# Patient Record
Sex: Female | Born: 1940 | Race: White | Hispanic: No | Marital: Married | Smoking: Never smoker
Health system: Southern US, Community
[De-identification: ages and names within clinical notes are randomized; demographics above are authoritative.]

## PROBLEM LIST (undated history)

## (undated) DIAGNOSIS — J449 Chronic obstructive pulmonary disease, unspecified: Secondary | ICD-10-CM

## (undated) DIAGNOSIS — E119 Type 2 diabetes mellitus without complications: Secondary | ICD-10-CM

## (undated) HISTORY — PX: ABDOMINAL HYSTERECTOMY: SHX81

## (undated) HISTORY — PX: APPENDECTOMY: SHX54

## (undated) HISTORY — PX: TONSILLECTOMY: SUR1361

## (undated) HISTORY — PX: REPLACEMENT TOTAL KNEE BILATERAL: SUR1225

## (undated) HISTORY — PX: CHOLECYSTECTOMY: SHX55

---

## 2004-12-30 ENCOUNTER — Ambulatory Visit: Payer: Self-pay | Admitting: Family Medicine

## 2006-09-26 ENCOUNTER — Ambulatory Visit: Payer: Self-pay | Admitting: Family Medicine

## 2007-12-07 ENCOUNTER — Ambulatory Visit: Payer: Self-pay | Admitting: Family Medicine

## 2007-12-30 ENCOUNTER — Ambulatory Visit: Payer: Self-pay | Admitting: Unknown Physician Specialty

## 2008-02-19 ENCOUNTER — Ambulatory Visit: Payer: Self-pay | Admitting: Unknown Physician Specialty

## 2008-03-27 ENCOUNTER — Ambulatory Visit: Payer: Self-pay | Admitting: Specialist

## 2008-05-20 ENCOUNTER — Ambulatory Visit: Payer: Self-pay

## 2009-11-28 ENCOUNTER — Ambulatory Visit: Payer: Self-pay | Admitting: Family Medicine

## 2011-03-09 ENCOUNTER — Ambulatory Visit: Payer: Self-pay | Admitting: Family Medicine

## 2012-03-09 ENCOUNTER — Ambulatory Visit: Payer: Self-pay | Admitting: Physician Assistant

## 2013-04-11 ENCOUNTER — Ambulatory Visit: Payer: Self-pay | Admitting: Family Medicine

## 2013-10-25 ENCOUNTER — Ambulatory Visit: Payer: Self-pay | Admitting: Physician Assistant

## 2014-03-01 ENCOUNTER — Ambulatory Visit: Payer: Self-pay | Admitting: Physician Assistant

## 2015-03-06 ENCOUNTER — Encounter: Payer: Self-pay | Admitting: Emergency Medicine

## 2015-03-06 ENCOUNTER — Ambulatory Visit (INDEPENDENT_AMBULATORY_CARE_PROVIDER_SITE_OTHER): Payer: Medicare HMO

## 2015-03-06 ENCOUNTER — Ambulatory Visit
Admission: EM | Admit: 2015-03-06 | Discharge: 2015-03-06 | Disposition: A | Discharge status: Home / Self Care | Payer: Medicare HMO | Attending: Family Medicine | Admitting: Family Medicine

## 2015-03-06 DIAGNOSIS — J40 Bronchitis, not specified as acute or chronic: Secondary | ICD-10-CM | POA: Diagnosis not present

## 2015-03-06 HISTORY — DX: Type 2 diabetes mellitus without complications: E11.9

## 2015-03-06 HISTORY — DX: Chronic obstructive pulmonary disease, unspecified: J44.9

## 2015-03-06 MED ORDER — AZITHROMYCIN 250 MG PO TABS: ORAL_TABLET | ORAL | Status: DC

## 2015-03-06 MED ORDER — HYDROCOD POLST-CPM POLST ER 10-8 MG/5ML PO SUER: 5.0000 mL | Freq: Two times a day (BID) | ORAL | Status: AC

## 2015-03-06 NOTE — ED Provider Notes (Signed)
CSN: [id number]     Arrival date & time 03/06/15  1044 History   First MD Initiated Contact with Patient 03/06/15 1122     Chief Complaint  Patient presents with  . Cough   (Consider location/radiation/quality/duration/timing/severity/associated sxs/prior Treatment) HPI   This a 74 year old female who presents with a 9 day history sinus drainage constant coughing of yellow greenish sputum chest congestion and sinus pressure. Denies any fevers. Has been using a nebulizer at home as she has a diagnosis of COPD along with diabetes mellitus. She has been sleeping in a semisitting position to try and help her cough. She is complaining of some shortness of breath but her O2 sat on room air is 99%. temperature today at 97.7F pulse 80 respirations 17 blood pressure 150/69. He has been using her inhalers at home along with a nebulizer.  Past Medical History  Diagnosis Date  . Diabetes mellitus without complication (HCC)   . COPD (chronic obstructive pulmonary disease) [facility])    Past Surgical History  Procedure Laterality Date  . Abdominal hysterectomy    . Cholecystectomy    . Appendectomy    . Tonsillectomy    . Replacement total knee bilateral     History reviewed. No pertinent family history. Social History  Substance Use Topics  . Smoking status: Never Smoker   . Smokeless tobacco: None  . Alcohol Use: No   OB History    No data available     Review of Systems  Constitutional: Positive for activity change. Negative for fever, chills, diaphoresis and fatigue.  HENT: Positive for congestion, postnasal drip, rhinorrhea, sinus pressure and sneezing.   Respiratory: Positive for cough, shortness of breath and wheezing. Negative for stridor.   All other systems reviewed and are negative.   Allergies  Penicillins and Codeine  Home Medications   Prior to Admission medications   Medication Sig Start Date End Date Taking? Authorizing Provider  albuterol (PROVENTIL HFA;VENTOLIN HFA)  108 (90 Base) MCG/ACT inhaler Inhale 2 puffs into the lungs every 4 (four) hours as needed for wheezing or shortness of breath.   Yes Historical Provider, MD  budesonide-formoterol (SYMBICORT) 160-4.5 MCG/ACT inhaler Inhale 2 puffs into the lungs 2 (two) times daily.   Yes Historical Provider, MD  glipiZIDE (GLUCOTROL) 10 MG tablet Take 10 mg by mouth daily before breakfast.   Yes Historical Provider, MD  sertraline (ZOLOFT) 100 MG tablet Take 200 mg by mouth daily.   Yes Historical Provider, MD  azithromycin (ZITHROMAX Z-PAK) 250 MG tablet Use as per package instructions 03/06/15   Lutricia Feil, PA-C  chlorpheniramine-HYDROcodone [facility]) 10-8 MG/5ML SUER Take 5 mLs by mouth 2 (two) times daily. 03/06/15   Lutricia Feil, PA-C   Meds Ordered and Administered this Visit  Medications - No data to display  BP 150/69 mmHg  Pulse 80  Temp(Src) 97.4 F (36.3 C) (Tympanic)  Resp 17  Ht  (1.626 m)  Wt 180 lb (81.647 kg)  BMI 30.88 kg/m2  SpO2 99% No data found.   Physical Exam  Constitutional: She is oriented to person, place, and time. She appears well-developed and well-nourished. No distress.  HENT:  Head: Normocephalic and atraumatic.  Nose: Nose normal.  Mouth/Throat: Oropharynx is clear and moist. No oropharyngeal exudate.  Both TMs are dull.  Eyes: Conjunctivae are normal. Pupils are equal, round, and reactive to light. Right eye exhibits no discharge. Left eye exhibits no discharge.  Neck: Normal range of motion.  Pulmonary/Chest:  Effort normal. No stridor. No respiratory distress. She has wheezes. She has no rales.  Musculoskeletal: Normal range of motion. She exhibits no edema or tenderness.  Lymphadenopathy:    She has no cervical adenopathy.  Neurological: She is alert and oriented to person, place, and time.  Skin: Skin is warm and dry. She is not diaphoretic.  Psychiatric: She has a normal mood and affect. Her behavior is normal. Judgment and  thought content normal.  Nursing note and vitals reviewed.   ED Course  Procedures (including critical care time)  Labs Review Labs Reviewed - No data to display  Imaging Review Dg Chest 2 View  03/06/2015  CLINICAL DATA:  Productive cough. EXAM: CHEST  2 VIEW COMPARISON:  March 01, 2014. FINDINGS: The heart size and mediastinal contours are within normal limits. Both lungs are clear. No pneumothorax or pleural effusion is noted. Stable small hiatal hernia is noted. The visualized skeletal structures are unremarkable. IMPRESSION: Stable small hiatal hernia. No acute cardiopulmonary abnormality seen. Electronically Signed   By: Lupita RaiderJames  Green Jr, M.D.   On: 03/06/2015 12:04     Visual Acuity Review  Right Eye Distance:   Left Eye Distance:   Bilateral Distance:    Right Eye Near:   Left Eye Near:    Bilateral Near:         MDM   1. Bronchitis    Discharge Medication List as of 03/06/2015 12:15 PM    START taking these medications   Details  azithromycin (ZITHROMAX Z-PAK) 250 MG tablet Use as per package instructions, Normal    chlorpheniramine-HYDROcodone (TUSSIONEX PENNKINETIC ER) 10-8 MG/5ML SUER Take 5 mLs by mouth 2 (two) times daily., Starting 03/06/2015, Until Discontinued, Print      Plan: 1. Test/x-ray results and diagnosis reviewed with patient 2. rx as per orders; risks, benefits, potential side effects reviewed with patient 3. Recommend supportive treatment with fluids and rest. Patient states that she is not allergic to hydrocodone is able to tolerate that well. She also has nebulizers and inhalers at home which she uses and will continue to use. She also states that the Z-Paks worked the best for her and this was prescribed. Reviewed the x-ray results which did not show any effusion or infiltrate. Pressure to [facility] next week 4. F/u prn if symptoms worsen or don't improve     Lutricia FeilWilliam P Roemer, PA-C 03/06/15  1218

## 2015-03-06 NOTE — ED Notes (Signed)
Patient c/o cough and chest congestion and sinus pressure for over a week.  Patient denies fevers.

## 2015-03-06 NOTE — Discharge Instructions (Signed)
How to Use an Inhaler °Proper inhaler technique is very important. Good technique ensures that the medicine reaches the lungs. Poor technique results in depositing the medicine on the tongue and back of the throat rather than in the airways. If you do not use the inhaler with good technique, the medicine will not help you. °STEPS TO FOLLOW IF USING AN INHALER WITHOUT AN EXTENSION TUBE °· Remove the cap from the inhaler. °· If you are using the inhaler for the first time, you will need to prime it. Shake the inhaler for 5 seconds and release four puffs into the air, away from your face. Ask your health care provider or pharmacist if you have questions about priming your inhaler. °· Shake the inhaler for 5 seconds before each breath in (inhalation). °· Position the inhaler so that the top of the canister faces up. °· Put your index finger on the top of the medicine canister. Your thumb supports the bottom of the inhaler. °· Open your mouth. °· Either place the inhaler between your teeth and place your lips tightly around the mouthpiece, or hold the inhaler 1-2 inches away from your open mouth. If you are unsure of which technique to use, ask your health care provider. °· Breathe out (exhale) normally and as completely as possible. °· Press the canister down with your index finger to release the medicine. °· At the same time as the canister is pressed, inhale deeply and slowly until your lungs are completely filled. This should take 4-6 seconds. Keep your tongue down. °· Hold the medicine in your lungs for 5-10 seconds (10 seconds is best). This helps the medicine get into the small airways of your lungs. °· Breathe out slowly, through pursed lips. Whistling is an example of pursed lips. °· Wait at least 15-30 seconds between puffs. Continue with the above steps until you have taken the number of puffs your health care provider has ordered. Do not use the inhaler more than your health care provider tells  you. °· Replace the cap on the inhaler. °· Follow the directions from your health care provider or the inhaler insert for cleaning the inhaler. °STEPS TO FOLLOW IF USING AN INHALER WITH AN EXTENSION (SPACER) °· Remove the cap from the inhaler. °· If you are using the inhaler for the first time, you will need to prime it. Shake the inhaler for 5 seconds and release four puffs into the air, away from your face. Ask your health care provider or pharmacist if you have questions about priming your inhaler. °· Shake the inhaler for 5 seconds before each breath in (inhalation). °· Place the open end of the spacer onto the mouthpiece of the inhaler. °· Position the inhaler so that the top of the canister faces up and the spacer mouthpiece faces you. °· Put your index finger on the top of the medicine canister. Your thumb supports the bottom of the inhaler and the spacer. °· Breathe out (exhale) normally and as completely as possible. °· Immediately after exhaling, place the spacer between your teeth and into your mouth. Close your lips tightly around the spacer. °· Press the canister down with your index finger to release the medicine. °· At the same time as the canister is pressed, inhale deeply and slowly until your lungs are completely filled. This should take 4-6 seconds. Keep your tongue down and out of the way. °· Hold the medicine in your lungs for 5-10 seconds (10 seconds is best). This helps the   medicine get into the small airways of your lungs. Exhale. °· Repeat inhaling deeply through the spacer mouthpiece. Again hold that breath for up to 10 seconds (10 seconds is best). Exhale slowly. If it is difficult to take this second deep breath through the spacer, breathe normally several times through the spacer. Remove the spacer from your mouth. °· Wait at least 15-30 seconds between puffs. Continue with the above steps until you have taken the number of puffs your health care provider has ordered. Do not use the  inhaler more than your health care provider tells you. °· Remove the spacer from the inhaler, and place the cap on the inhaler. °· Follow the directions from your health care provider or the inhaler insert for cleaning the inhaler and spacer. °If you are using different kinds of inhalers, use your quick relief medicine to open the airways 10-15 minutes before using a steroid if instructed to do so by your health care provider. If you are unsure which inhalers to use and the order of using them, ask your health care provider, nurse, or respiratory therapist. °If you are using a steroid inhaler, always rinse your mouth with water after your last puff, then gargle and spit out the water. Do not swallow the water. °AVOID: °· Inhaling before or after starting the spray of medicine. It takes practice to coordinate your breathing with triggering the spray. °· Inhaling through the nose (rather than the mouth) when triggering the spray. °HOW TO DETERMINE IF YOUR INHALER IS FULL OR NEARLY EMPTY °You cannot know when an inhaler is empty by shaking it. A few inhalers are now being made with dose counters. Ask your health care provider for a prescription that has a dose counter if you feel you need that extra help. If your inhaler does not have a counter, ask your health care provider to help you determine the date you need to refill your inhaler. Write the refill date on a calendar or your inhaler canister. Refill your inhaler 7-10 days before it runs out. Be sure to keep an adequate supply of medicine. This includes making sure it is not expired, and that you have a spare inhaler.  °SEEK MEDICAL CARE IF:  °· Your symptoms are only partially relieved with your inhaler. °· You are having trouble using your inhaler. °· You have some increase in phlegm. °SEEK IMMEDIATE MEDICAL CARE IF:  °· You feel little or no relief with your inhalers. You are still wheezing and are feeling shortness of breath or tightness in your chest or  both. °· You have dizziness, headaches, or a fast heart rate. °· You have chills, fever, or night sweats. °· You have a noticeable increase in phlegm production, or there is blood in the phlegm. °MAKE SURE YOU:  °· Understand these instructions. °· Will watch your condition. °· Will get help right away if you are not doing well or get worse. °  °This information is not intended to replace advice given to you by your health care provider. Make sure you discuss any questions you have with your health care provider. °  °Document Released: 02/18/2000 Document Revised: 12/11/2012 Document Reviewed: 09/19/2012 °Elsevier Interactive Patient Education ©2016 [employer] ° °Upper Respiratory Infection, Adult °Most upper respiratory infections (URIs) are a viral infection of the air passages leading to the lungs. A URI affects the nose, throat, and upper air passages. The most common type of URI is nasopharyngitis and is typically referred to as "the common cold." °  URIs run their course and usually go away on their own. Most of the time, a URI does not require medical attention, but sometimes a bacterial infection in the upper airways can follow a viral infection. This is called a secondary infection. Sinus and middle ear infections are common types of secondary upper respiratory infections. °Bacterial pneumonia can also complicate a URI. A URI can worsen asthma and chronic obstructive pulmonary disease (COPD). Sometimes, these complications can require emergency medical care and may be life threatening.  °CAUSES °Almost all URIs are caused by viruses. A virus is a type of germ and can spread from one person to another.  °RISKS FACTORS °You may be at risk for a URI if:  °· You smoke.   °· You have chronic heart or lung disease. °· You have a weakened defense (immune) system.   °· You are very young or very old.   °· You have nasal allergies or asthma. °· You work in crowded or poorly ventilated areas. °· You work in health  care facilities or schools. °SIGNS AND SYMPTOMS  °Symptoms typically develop 2-3 days after you come in contact with a cold virus. Most viral URIs last 7-10 days. However, viral URIs from the influenza virus (flu virus) can last 14-18 days and are typically more severe. Symptoms may include:  °· Runny or stuffy (congested) nose.   °· Sneezing.   °· Cough.   °· Sore throat.   °· Headache.   °· Fatigue.   °· Fever.   °· Loss of appetite.   °· Pain in your forehead, behind your eyes, and over your cheekbones (sinus pain). °· Muscle aches.   °DIAGNOSIS  °Your health care provider may diagnose a URI by: °· Physical exam. °· Tests to check that your symptoms are not due to another condition such as: °¨ Strep throat. °¨ Sinusitis. °¨ Pneumonia. °¨ Asthma. °TREATMENT  °A URI goes away on its own with time. It cannot be cured with medicines, but medicines may be prescribed or recommended to relieve symptoms. Medicines may help: °· Reduce your fever. °· Reduce your cough. °· Relieve nasal congestion. °HOME CARE INSTRUCTIONS  °· Take medicines only as directed by your health care provider.   °· Gargle warm saltwater or take cough drops to comfort your throat as directed by your health care provider. °· Use a warm mist humidifier or inhale steam from a shower to increase air moisture. This may make it easier to breathe. °· Drink enough fluid to keep your urine clear or pale yellow.   °· Eat soups and other clear broths and maintain good nutrition.   °· Rest as needed.   °· Return to work when your temperature has returned to normal or as your health care provider advises. You may need to stay home longer to avoid infecting others. You can also use a face mask and careful hand washing to prevent spread of the virus. °· Increase the usage of your inhaler if you have asthma.   °· Do not use any tobacco products, including cigarettes, chewing tobacco, or electronic cigarettes. If you need help quitting, ask your health care  provider. °PREVENTION  °The best way to protect yourself from getting a cold is to practice good hygiene.  °· Avoid oral or hand contact with people with cold symptoms.   °· Wash your hands often if contact occurs.   °There is no clear evidence that vitamin C, vitamin E, echinacea, or exercise reduces the chance of developing a cold. However, it is always recommended to get plenty of rest, exercise, and practice good nutrition.  °  SEEK MEDICAL CARE IF:  °· You are getting worse rather than better.   °· Your symptoms are not controlled by medicine.   °· You have chills. °· You have worsening shortness of breath. °· You have brown or red mucus. °· You have yellow or brown nasal discharge. °· You have pain in your face, especially when you bend forward. °· You have a fever. °· You have swollen neck glands. °· You have pain while swallowing. °· You have white areas in the back of your throat. °SEEK IMMEDIATE MEDICAL CARE IF:  °· You have severe or persistent: °¨ Headache. °¨ Ear pain. °¨ Sinus pain. °¨ Chest pain. °· You have chronic lung disease and any of the following: °¨ Wheezing. °¨ Prolonged cough. °¨ Coughing up blood. °¨ A change in your usual mucus. °· You have a stiff neck. °· You have changes in your: °¨ Vision. °¨ Hearing. °¨ Thinking. °¨ Mood. °MAKE SURE YOU:  °· Understand these instructions. °· Will watch your condition. °· Will get help right away if you are not doing well or get worse. °  °This information is not intended to replace advice given to you by your health care provider. Make sure you discuss any questions you have with your health care provider. °  °Document Released: 08/16/2000 Document Revised: 07/07/2014 Document Reviewed: 05/28/2013 °Elsevier Interactive Patient Education ©2016 Elsevier Inc. ° °

## 2015-06-13 ENCOUNTER — Ambulatory Visit
Admission: EM | Admit: 2015-06-13 | Discharge: 2015-06-13 | Disposition: A | Discharge status: Home / Self Care | Payer: Medicare HMO | Attending: Family Medicine | Admitting: Family Medicine

## 2015-06-13 ENCOUNTER — Encounter: Payer: Self-pay | Admitting: Gynecology

## 2015-06-13 DIAGNOSIS — S161XXA Strain of muscle, fascia and tendon at neck level, initial encounter: Secondary | ICD-10-CM

## 2015-06-13 MED ORDER — CYCLOBENZAPRINE HCL 10 MG PO TABS: 10.0000 mg | ORAL_TABLET | Freq: Every day | ORAL | Status: AC

## 2015-06-13 MED ORDER — HYDROCODONE-ACETAMINOPHEN 5-325 MG PO TABS: 1.0000 | ORAL_TABLET | Freq: Four times a day (QID) | ORAL | Status: AC | PRN

## 2015-06-13 NOTE — ED Notes (Signed)
Per patient stiff neck on right side of neck x 2 weeks.

## 2015-06-13 NOTE — ED Provider Notes (Signed)
CSN: [id number]     Arrival date & time 06/13/15  0930 History   None    Chief Complaint  Patient presents with  . Torticollis   (Consider location/radiation/quality/duration/timing/severity/associated sxs/prior Treatment) HPI Comments: 75 yo female with a 2 weeks h/o right sided neck pain and stiffness. Denies any injury/trauma, numbness/tingling, fevers, chills. Pain is worse with movement or certain positions.   The history is provided by the patient.    Past Medical History  Diagnosis Date  . Diabetes mellitus without complication (HCC)   . COPD (chronic obstructive pulmonary disease) [facility](HCC)    Past Surgical History  Procedure Laterality Date  . Abdominal hysterectomy    . Cholecystectomy    . Appendectomy    . Tonsillectomy    . Replacement total knee bilateral     No family history on file. Social History  Substance Use Topics  . Smoking status: Never Smoker   . Smokeless tobacco: None  . Alcohol Use: No   OB History    No data available     Review of Systems  Allergies  Penicillins and Codeine  Home Medications   Prior to Admission medications   Medication Sig Start Date End Date Taking? Authorizing Provider  budesonide-formoterol (SYMBICORT) 160-4.5 MCG/ACT inhaler Inhale 2 puffs into the lungs 2 (two) times daily.   Yes Historical Provider, MD  glipiZIDE (GLUCOTROL) 10 MG tablet Take 10 mg by mouth daily before breakfast.   Yes Historical Provider, MD  sertraline (ZOLOFT) 100 MG tablet Take 200 mg by mouth daily.   Yes Historical Provider, MD  albuterol (PROVENTIL HFA;VENTOLIN HFA) 108 (90 Base) MCG/ACT inhaler Inhale 2 puffs into the lungs every 4 (four) hours as needed for wheezing or shortness of breath.    Historical Provider, MD  azithromycin (ZITHROMAX Z-PAK) 250 MG tablet Use as per package instructions 03/06/15   Lutricia FeilWilliam P Roemer, PA-C  chlorpheniramine-HYDROcodone [facility](TUSSIONEX PENNKINETIC ER) 10-8 MG/5ML SUER Take 5 mLs by mouth 2 (two) times daily.  03/06/15   Lutricia FeilWilliam P Roemer, PA-C  cyclobenzaprine (FLEXERIL) 10 MG tablet Take 1 tablet (10 mg total) by mouth at bedtime. 06/13/15   Payton Mccallumrlando Jaren Kearn, MD  HYDROcodone-acetaminophen (NORCO/VICODIN) 5-325 MG tablet Take 1-2 tablets by mouth every 6 (six) hours as needed. 06/13/15   Payton Mccallumrlando Briceyda Abdullah, MD   Meds Ordered and Administered this Visit  Medications - No data to display  BP 135/79 mmHg  Pulse 68  Temp(Src) 98.1 F (36.7 C) (Oral)  Resp 16  Ht 5\' 3"  (1.6 m)  Wt 180 lb (81.647 kg)  BMI 31.89 kg/m2  SpO2 98% No data found.   Physical Exam  Constitutional: She is oriented to person, place, and time. She appears well-developed and well-nourished. No distress.  HENT:  Head: Normocephalic and atraumatic.  Eyes: EOM are normal. Pupils are equal, round, and reactive to light.  Neck: Normal range of motion. Neck supple. No tracheal deviation present. No thyromegaly present.  Musculoskeletal: She exhibits no edema.       Cervical back: She exhibits tenderness (over the cervical paraspinous muscles) and spasm. She exhibits normal range of motion, no bony tenderness, no swelling, no edema, no deformity, no laceration and normal pulse.  Lymphadenopathy:    She has no cervical adenopathy.  Neurological: She is alert and oriented to person, place, and time. She has normal reflexes. No cranial nerve deficit. She exhibits normal muscle tone. Coordination normal.  Skin: No rash noted. She is not diaphoretic.  Nursing note and vitals reviewed.  ED Course  Procedures (including critical care time)  Labs Review Labs Reviewed - No data to display  Imaging Review No results found.   Visual Acuity Review  Right Eye Distance:   Left Eye Distance:   Bilateral Distance:    Right Eye Near:   Left Eye Near:    Bilateral Near:         MDM   1. Neck strain, initial encounter    Discharge Medication List as of 06/13/2015 11:50 AM    START taking these medications   Details   cyclobenzaprine (FLEXERIL) 10 MG tablet Take 1 tablet (10 mg total) by mouth at bedtime., Starting 06/13/2015, Until Discontinued, Normal    HYDROcodone-acetaminophen (NORCO/VICODIN) 5-325 MG tablet Take 1-2 tablets by mouth every 6 (six) hours as needed., Starting 06/13/2015, Until Discontinued, Print       1. diagnosis reviewed with patient 2. rx as per orders above; reviewed possible side effects, interactions, risks and benefits  3. Recommend supportive treatment with heat, gentle range of motion exercises, massage 4. Follow-up prn if symptoms worsen or don't improve    Payton Mccallum, MD 06/13/15 1404

## 2016-05-25 ENCOUNTER — Other Ambulatory Visit: Payer: Self-pay | Admitting: Physician Assistant

## 2016-05-25 DIAGNOSIS — Z1239 Encounter for other screening for malignant neoplasm of breast: Secondary | ICD-10-CM

## 2016-06-23 ENCOUNTER — Ambulatory Visit: Payer: Medicare HMO

## 2016-06-29 ENCOUNTER — Ambulatory Visit: Payer: Medicare HMO

## 2016-09-07 ENCOUNTER — Ambulatory Visit
Admission: RE | Admit: 2016-09-07 | Discharge: 2016-09-07 | Disposition: A | Discharge status: Home / Self Care | Payer: Medicare HMO | Source: Ambulatory Visit | Attending: Physician Assistant | Admitting: Physician Assistant

## 2016-09-07 DIAGNOSIS — Z1231 Encounter for screening mammogram for malignant neoplasm of breast: Secondary | ICD-10-CM | POA: Insufficient documentation

## 2016-09-07 DIAGNOSIS — Z1239 Encounter for other screening for malignant neoplasm of breast: Secondary | ICD-10-CM

## 2018-01-13 IMAGING — MG MM DIGITAL SCREENING BILAT W/ CAD
4 series · 4 of 4 positions shown · non-contrast
Comparison: Previous exam(s).

CLINICAL DATA: Screening.

EXAM:
DIGITAL SCREENING BILATERAL MAMMOGRAM WITH CAD

[L MLO]
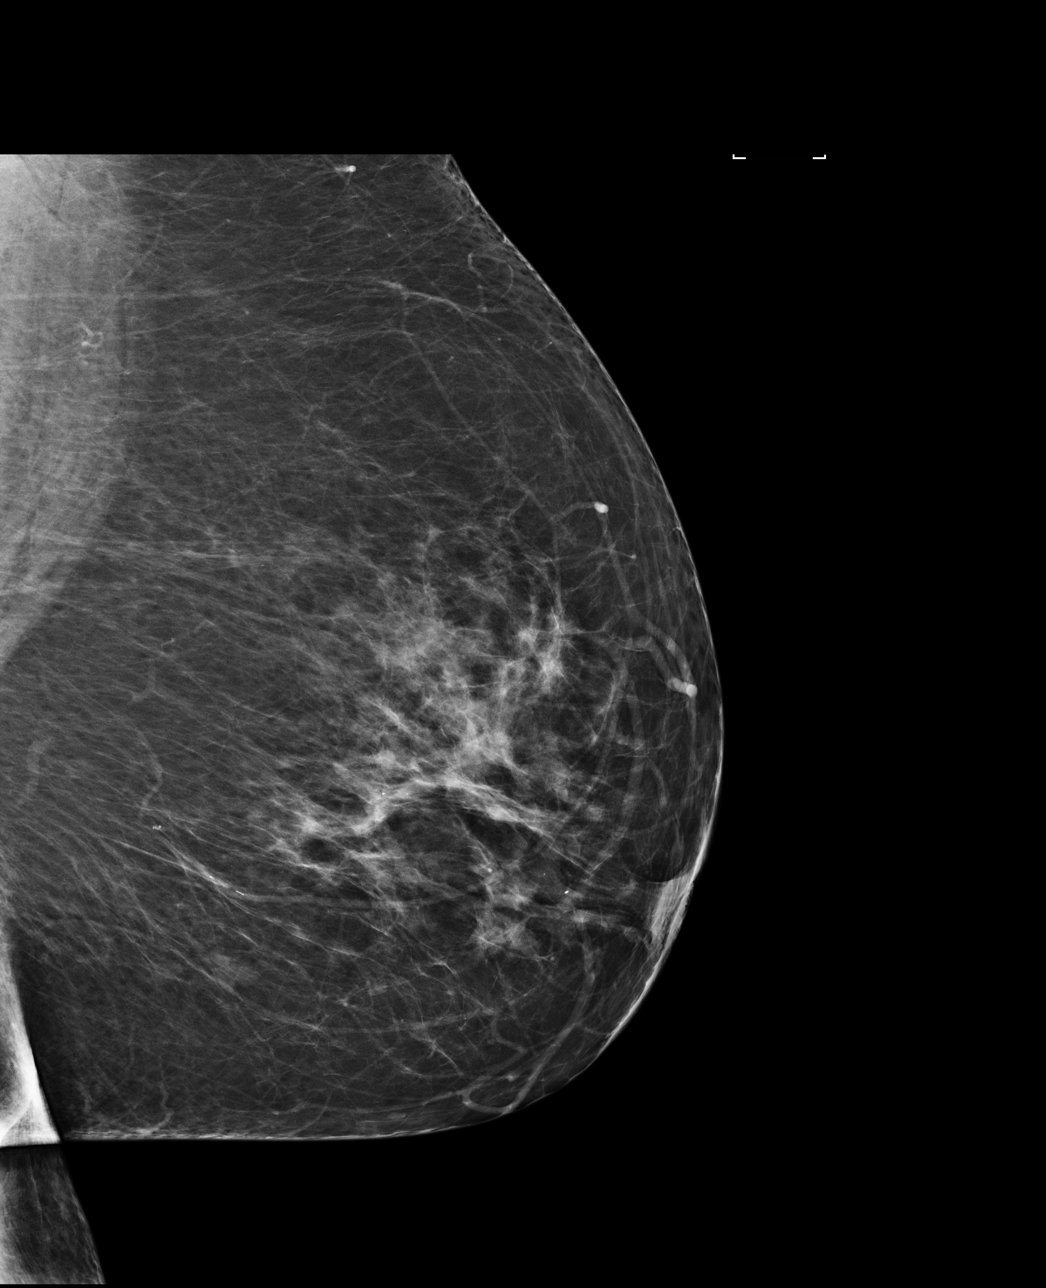

[R CC]
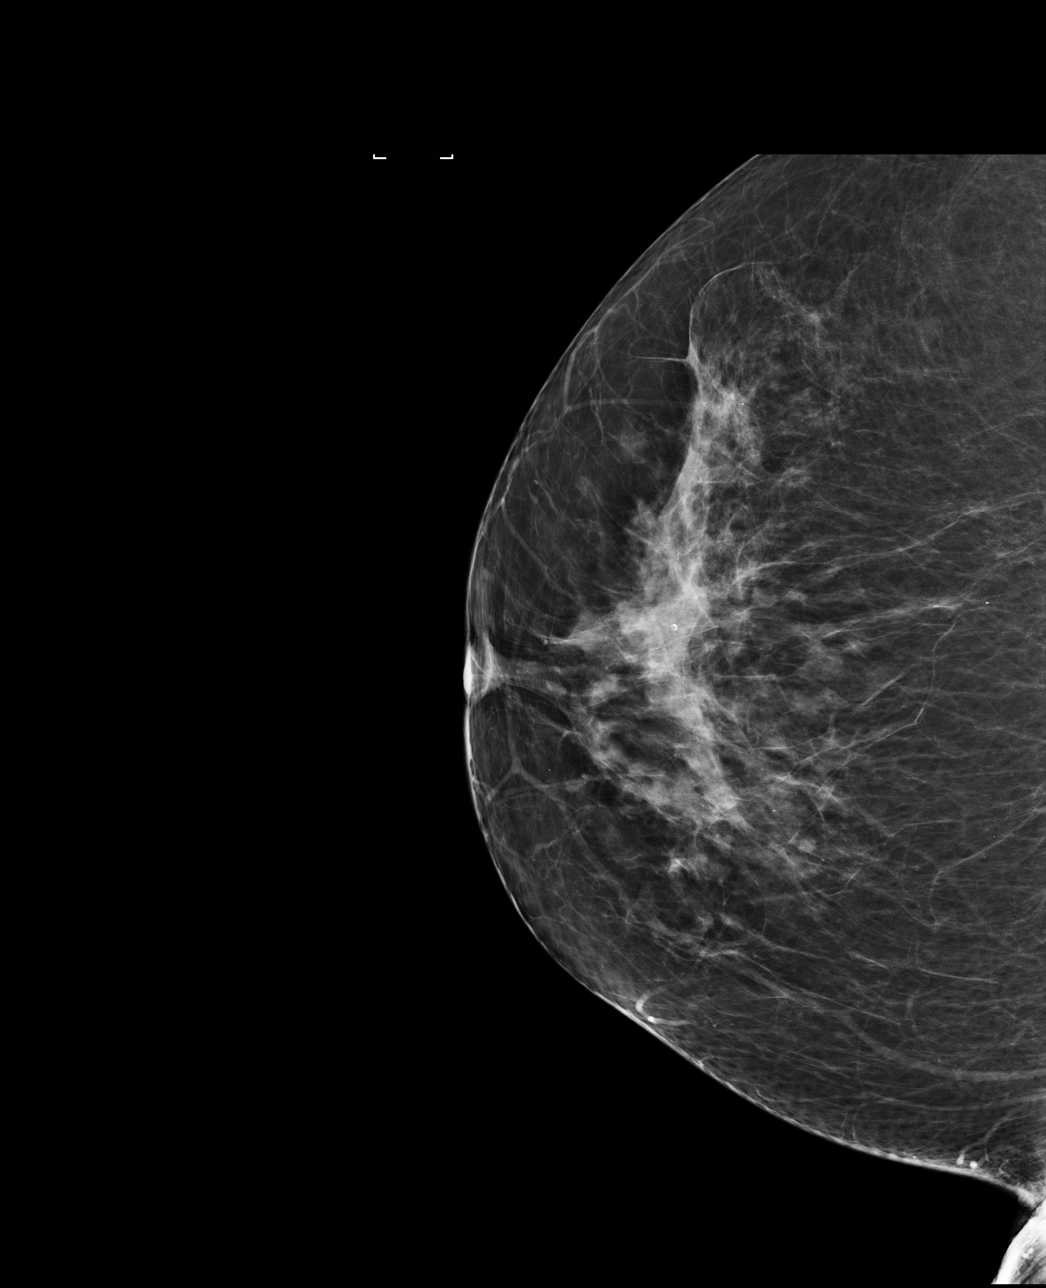

[R MLO]
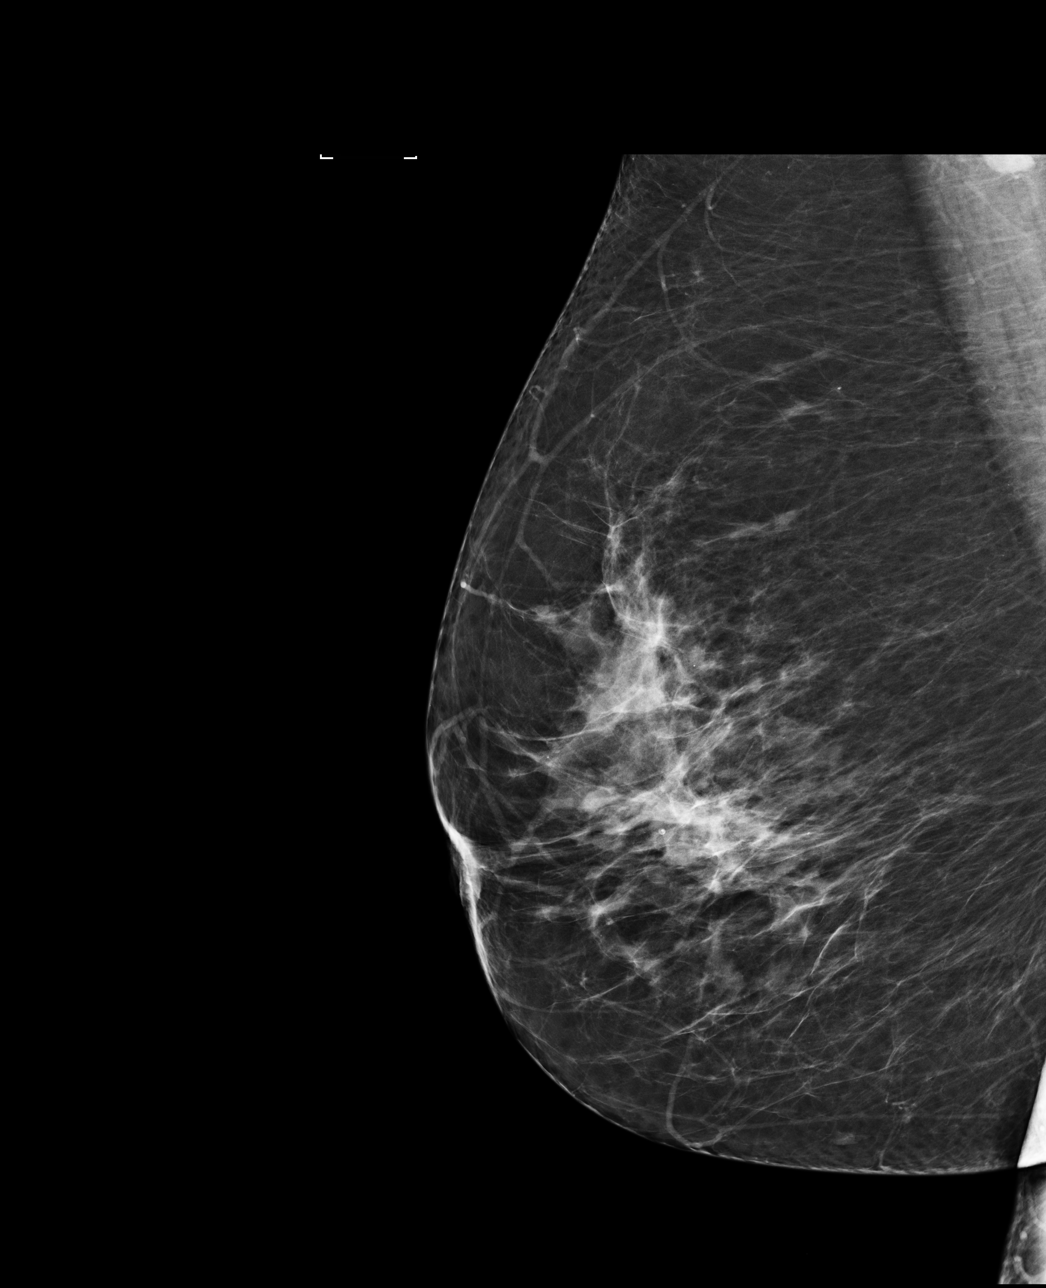

[L CC]
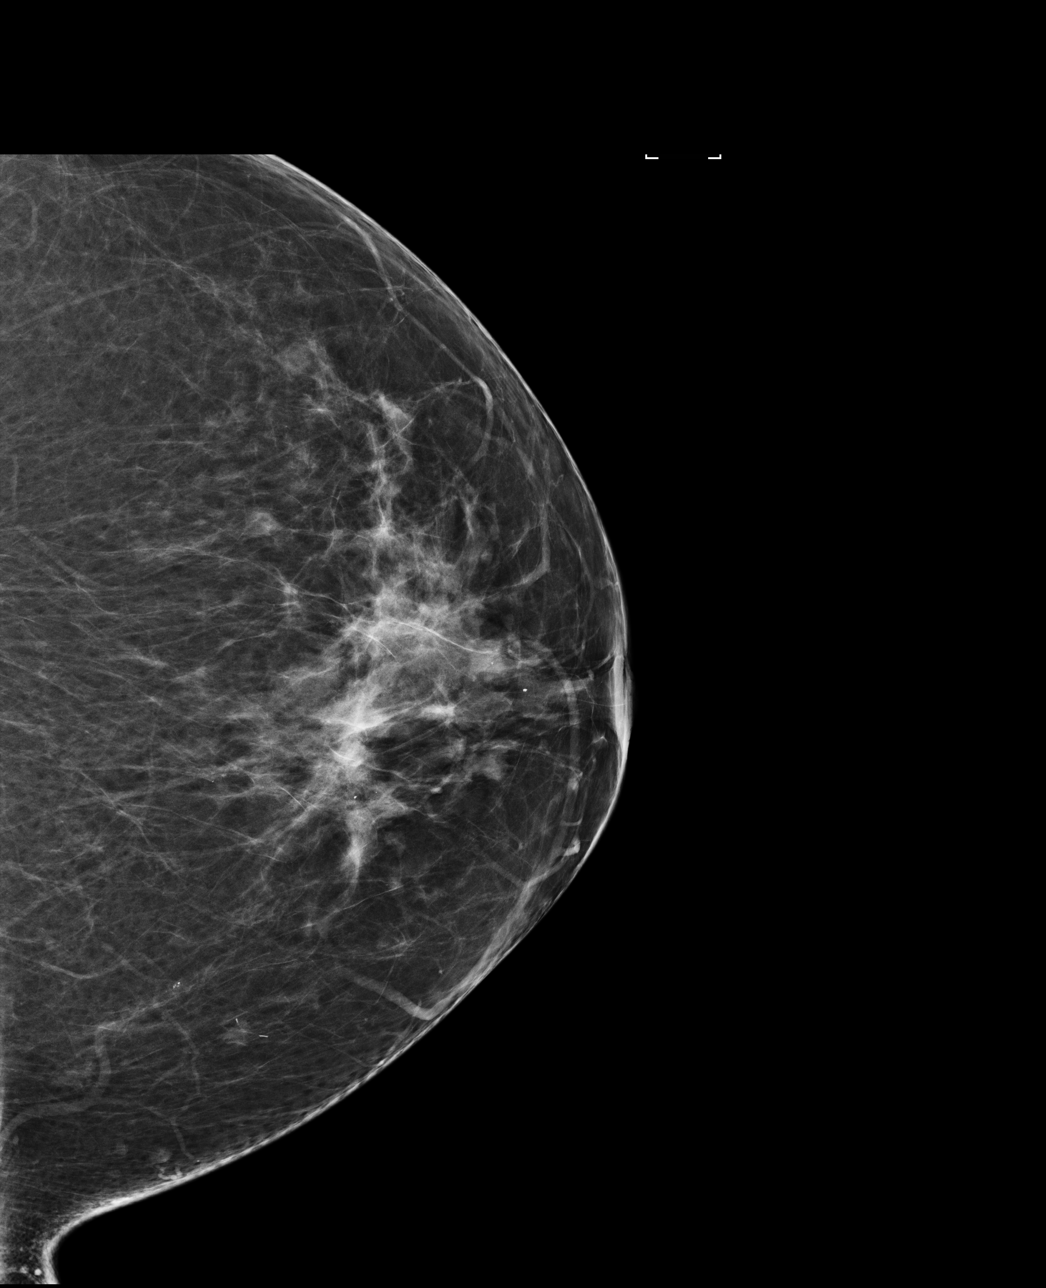

[4 of 4 positions shown; findings below may reference images not displayed]

ACR Breast Density Category c: The breast tissue is heterogeneously
dense, which may obscure small masses.
FINDINGS: There are no findings suspicious for malignancy. Images were
processed with CAD.
IMPRESSION: No mammographic evidence of malignancy. A result letter of this
screening mammogram will be mailed directly to the patient.

RECOMMENDATION:
Screening mammogram in one year. (Code:[id number])

BI-RADS CATEGORY  1: Negative.

## 2018-04-12 ENCOUNTER — Other Ambulatory Visit: Payer: Self-pay | Admitting: Physician Assistant

## 2018-04-12 DIAGNOSIS — M8580 Other specified disorders of bone density and structure, unspecified site: Secondary | ICD-10-CM

## 2018-04-12 DIAGNOSIS — Z1231 Encounter for screening mammogram for malignant neoplasm of breast: Secondary | ICD-10-CM

## 2018-11-07 ENCOUNTER — Other Ambulatory Visit: Payer: Self-pay | Admitting: Family Medicine

## 2018-11-07 ENCOUNTER — Other Ambulatory Visit: Payer: Self-pay | Admitting: Student

## 2019-09-05 ENCOUNTER — Other Ambulatory Visit: Payer: Self-pay

## 2019-09-05 ENCOUNTER — Encounter: Payer: Self-pay | Admitting: Emergency Medicine

## 2019-09-05 ENCOUNTER — Ambulatory Visit
Admission: EM | Admit: 2019-09-05 | Discharge: 2019-09-05 | Disposition: A | Discharge status: Home / Self Care | Payer: Medicare Other | Attending: Family Medicine | Admitting: Family Medicine

## 2019-09-05 DIAGNOSIS — R059 Cough, unspecified: Secondary | ICD-10-CM

## 2019-09-05 DIAGNOSIS — J209 Acute bronchitis, unspecified: Secondary | ICD-10-CM | POA: Diagnosis not present

## 2019-09-05 DIAGNOSIS — R0600 Dyspnea, unspecified: Secondary | ICD-10-CM

## 2019-09-05 DIAGNOSIS — R0602 Shortness of breath: Secondary | ICD-10-CM

## 2019-09-05 DIAGNOSIS — R05 Cough: Secondary | ICD-10-CM

## 2019-09-05 MED ORDER — DEXAMETHASONE SODIUM PHOSPHATE 10 MG/ML IJ SOLN
10.0000 mg | Freq: Once | INTRAMUSCULAR | Status: AC
  Administered 2019-09-05: 10 mg via INTRAMUSCULAR

## 2019-09-05 MED ORDER — PREDNISONE 10 MG (21) PO TBPK: ORAL_TABLET | Freq: Every day | ORAL | 0 refills | Status: AC

## 2019-09-05 MED ORDER — PREDNISONE 10 MG (21) PO TBPK: ORAL_TABLET | Freq: Every day | ORAL | 0 refills | Status: DC

## 2019-09-05 NOTE — ED Triage Notes (Signed)
Patient c/o cough, SOB and chest congestion for the past 3 days. Patient denies fevers.

## 2019-09-05 NOTE — Discharge Instructions (Addendum)
Take 6 tabs by mouth daily  for 2 days, then 5 tabs for 2 days, then 4 tabs for 2 days, then 3 tabs for 2 days, 2 tabs for 2 days, then 1 tab by mouth daily for 2 days  Follow up with this office or with primary care as needed  Go to the ER with further shortening of breath, trouble swallowing, other concerning symptoms.

## 2019-09-05 NOTE — ED Provider Notes (Signed)
[facility]   [id number] 09/05/19 Arrival Time: 0922   SUBJECTIVE:  Cassidy Reyes is a 79 y.o. female who presents with complaint of nasal congestion, post-nasal drainage, and a persistent cough. Onset abrupt approximately 3 days ago. Overall fatigued. SOB: mild. Wheezing: moderate. OTC treatment: has tried nebulizer and inhaler with little relief on their own, has also tried Zyrtec. Social History   Tobacco Use  Smoking Status Never Smoker  Smokeless Tobacco Never Used    ROS: As per HPI.   OBJECTIVE:  Vitals:   09/05/19 0934 09/05/19 0937  BP:  (!) 131/57  Pulse:  83  Resp:  16  Temp:  98.3 F (36.8 C)  TempSrc:  Oral  SpO2:  99%  Weight: 180 lb (81.6 kg)   Height: 5\' 4"  (1.626 m)      General appearance: alert; no distress HEENT: nasal congestion; clear runny nose; throat irritation secondary to post-nasal drainage Neck: supple without LAD Lungs: diffuse wheezing throughout lung fields Skin: warm and dry Psychological: alert and cooperative; normal mood and affect  No results found for this or any previous visit.  Labs Reviewed - No data to display  No results found.  Allergies  Allergen Reactions   Penicillins Anaphylaxis   Codeine Palpitations    Past Medical History:  Diagnosis Date   COPD (chronic obstructive pulmonary disease) (HCC)    Diabetes mellitus without complication (HCC)    Social History   Socioeconomic History   Marital status: Married    Spouse name: Not on file   Number of children: Not on file   Years of education: Not on file   Highest education level: Not on file  Occupational History   Not on file  Tobacco Use   Smoking status: Never Smoker   Smokeless tobacco: Never Used  Vaping Use   Vaping Use: Never used  Substance and Sexual Activity   Alcohol use: No   Drug use: Not on file   Sexual activity: Not on file  Other Topics Concern   Not on file  Social History Narrative   Not on  file   Social Determinants of Health   Financial Resource Strain:    Difficulty of Paying Living Expenses:   Food Insecurity:    Worried About in the Last Year:    Programme researcher, broadcasting/film/video in the Last Year:   Transportation Needs:    Barista (Medical):    Lack of Transportation (Non-Medical):   Physical Activity:    Days of Exercise per Week:    Minutes of Exercise per Session:   Stress:    Feeling of Stress :   Social Connections:    Frequency of Communication with Friends and Family:    Frequency of Social Gatherings with Friends and Family:    Attends Religious Services:    Active Member of Clubs or Organizations:    Attends Freight forwarder:    Marital Status:   Intimate Partner Violence:    Fear of Current or Ex-Partner:    Emotionally Abused:    Physically Abused:    Sexually Abused:    Family History  Problem Relation Age of Onset   Breast cancer Paternal Grandmother      ASSESSMENT & PLAN:  1. Acute bronchitis, unspecified organism   2. Cough   3. Dyspnea, unspecified type   4. Shortness of breath     Meds ordered this encounter  Medications   dexamethasone (  DECADRON) injection 10 mg   predniSONE (STERAPRED UNI-PAK 21 TAB) 10 MG (21) TBPK tablet    Sig: Take by mouth daily. Take 6 tabs by mouth daily  for 2 days, then 5 tabs for 2 days, then 4 tabs for 2 days, then 3 tabs for 2 days, 2 tabs for 2 days, then 1 tab by mouth daily for 2 days    Dispense:  42 tablet    Refill:  0    Order Specific Question:   Supervising Provider    Answer:   Merrilee Jansky [id number]   Bronchitis  Will treat with above therapy given duration of symptoms.  Cough medication sedation precautions.  OTC symptom care as needed. Will plan f/u with PCP or here as needed.  Reviewed expectations re: course of current medical issues. Questions answered. Outlined signs and symptoms indicating need for more acute  intervention. Patient verbalized understanding. After Visit Summary given.            Moshe Cipro, NP 09/05/19 1004

## 2020-02-27 ENCOUNTER — Encounter: Payer: Self-pay | Admitting: Emergency Medicine

## 2020-02-27 ENCOUNTER — Other Ambulatory Visit: Payer: Self-pay

## 2020-02-27 ENCOUNTER — Ambulatory Visit
Admission: EM | Admit: 2020-02-27 | Discharge: 2020-02-27 | Disposition: A | Discharge status: Home / Self Care | Payer: Medicare Other | Attending: Physician Assistant | Admitting: Physician Assistant

## 2020-02-27 DIAGNOSIS — N390 Urinary tract infection, site not specified: Secondary | ICD-10-CM | POA: Diagnosis not present

## 2020-02-27 DIAGNOSIS — R300 Dysuria: Secondary | ICD-10-CM | POA: Insufficient documentation

## 2020-02-27 LAB — URINALYSIS, COMPLETE (UACMP) WITH MICROSCOPIC
Specific Gravity, Urine: 1.015 (ref 1.005–1.030)
pH: 5 (ref 5.0–8.0)

## 2020-02-27 MED ORDER — NITROFURANTOIN MONOHYD MACRO 100 MG PO CAPS: 100.0000 mg | ORAL_CAPSULE | Freq: Two times a day (BID) | ORAL | 0 refills | Status: AC

## 2020-02-27 MED ORDER — FLUCONAZOLE 150 MG PO TABS: ORAL_TABLET | ORAL | 0 refills | Status: AC

## 2020-02-27 NOTE — ED Provider Notes (Signed)
[facility]    CSN: [id number] Arrival date & time: 02/27/20  2992      History   Chief Complaint Chief Complaint  Patient presents with  . Dysuria    HPI Cassidy Reyes is a 79 y.o. female presenting for dysuria and urinary urgency x2 weeks.  Patient states she has been taking AZO which occasionally helps the pain.  She says symptoms are getting a little bit worse.  She denies any fever, fatigue, flank pain, abdominal pain, nausea/vomiting, hematuria, vaginal discharge.  Past medical history significant for diabetes and COPD.  Surgical history significant for abdominal hysterectomy, appendectomy, and cholecystectomy.  She has no other complaints or concerns at this time.  HPI  Past Medical History:  Diagnosis Date  . COPD (chronic obstructive pulmonary disease) (HCC)   . Diabetes mellitus without complication (HCC)     There are no problems to display for this patient.   Past Surgical History:  Procedure Laterality Date  . ABDOMINAL HYSTERECTOMY    . APPENDECTOMY    . CHOLECYSTECTOMY    . REPLACEMENT TOTAL KNEE BILATERAL    . TONSILLECTOMY      OB History   No obstetric history on file.      Home Medications    Prior to Admission medications   Medication Sig Start Date End Date Taking? Authorizing Provider  albuterol (PROVENTIL HFA;VENTOLIN HFA) 108 (90 Base) MCG/ACT inhaler Inhale 2 puffs into the lungs every 4 (four) hours as needed for wheezing or shortness of breath.   Yes [provider]  apixaban (ELIQUIS) 5 MG TABS tablet Eliquis 5 mg tablet 07/31/19  Yes [provider]  glipiZIDE (GLUCOTROL) 10 MG tablet Take 10 mg by mouth daily before breakfast.   Yes [provider]  budesonide-formoterol (SYMBICORT) 160-4.5 MCG/ACT inhaler Inhale 2 puffs into the lungs 2 (two) times daily.    [provider]  chlorpheniramine-HYDROcodone (TUSSIONEX PENNKINETIC ER) 10-8 MG/5ML SUER Take 5 mLs by mouth 2 (two) times  daily. 03/06/15   Lutricia Feil, PA-C  cyclobenzaprine (FLEXERIL) 10 MG tablet Take 1 tablet (10 mg total) by mouth at bedtime. 06/13/15   Payton Mccallum, MD  DULoxetine (CYMBALTA) 60 MG capsule Take 60 mg by mouth daily. 01/19/20   [provider]  fluconazole (DIFLUCAN) 150 MG tablet Take 1 tablet every 72 hours as needed for yeast infection 02/27/20   Shirlee Latch, PA-C  HYDROcodone-acetaminophen (NORCO/VICODIN) 5-325 MG tablet Take 1-2 tablets by mouth every 6 (six) hours as needed. 06/13/15   Payton Mccallum, MD  nitrofurantoin, macrocrystal-monohydrate, (MACROBID) 100 MG capsule Take 1 capsule (100 mg total) by mouth 2 (two) times daily for 5 days. 02/27/20 03/03/20  Shirlee Latch, PA-C  predniSONE (STERAPRED UNI-PAK 21 TAB) 10 MG (21) TBPK tablet Take by mouth daily. Take 6 tabs by mouth daily  for 2 days, then 5 tabs for 2 days, then 4 tabs for 2 days, then 3 tabs for 2 days, 2 tabs for 2 days, then 1 tab by mouth daily for 2 days 09/05/19   Moshe Cipro, NP  sertraline (ZOLOFT) 100 MG tablet Take 200 mg by mouth daily.    [provider]    Family History Family History  Problem Relation Age of Onset  . Breast cancer Paternal Grandmother     Social History Social History   Tobacco Use  . Smoking status: Never Smoker  . Smokeless tobacco: Never Used  Vaping Use  . Vaping Use: Never  used  Substance Use Topics  . Alcohol use: No     Allergies   Penicillins and Codeine   Review of Systems Review of Systems  Constitutional: Negative for chills and fever.  Gastrointestinal: Negative for abdominal pain, diarrhea, nausea and vomiting.  Genitourinary: Positive for dysuria, frequency and urgency. Negative for decreased urine volume, flank pain, hematuria, pelvic pain, vaginal bleeding, vaginal discharge and vaginal pain.  Musculoskeletal: Negative for back pain.  Skin: Negative for rash.     Physical Exam Triage Vital Signs ED Triage Vitals  Enc  Vitals Group     BP 02/27/20 1036 (!) 143/73     Pulse Rate 02/27/20 1036 88     Resp 02/27/20 1036 14     Temp 02/27/20 1036 98.4 F (36.9 C)     Temp Source 02/27/20 1036 Oral     SpO2 02/27/20 1036 95 %     Weight 02/27/20 1032 180 lb (81.6 kg)     Height 02/27/20 1032 5\' 4"  (1.626 m)     Head Circumference --      Peak Flow --      Pain Score 02/27/20 1032 8     Pain Loc --      Pain Edu? --      Excl. in GC? --    No data found.  Updated Vital Signs BP (!) 143/73 (BP Location: Left Arm)   Pulse 88   Temp 98.4 F (36.9 C) (Oral)   Resp 14   Ht 5\' 4"  (1.626 m)   Wt 180 lb (81.6 kg)   SpO2 95%   BMI 30.90 kg/m       Physical Exam Vitals and nursing note reviewed.  Constitutional:      General: She is not in acute distress.    Appearance: Normal appearance. She is obese. She is not ill-appearing or toxic-appearing.  HENT:     Head: Normocephalic and atraumatic.  Eyes:     General: No scleral icterus.       Right eye: No discharge.        Left eye: No discharge.     Conjunctiva/sclera: Conjunctivae normal.  Cardiovascular:     Rate and Rhythm: Normal rate and regular rhythm.     Heart sounds: Normal heart sounds.  Pulmonary:     Effort: Pulmonary effort is normal. No respiratory distress.     Breath sounds: Normal breath sounds.  Abdominal:     Palpations: Abdomen is soft.     Tenderness: There is abdominal tenderness (mild suprapubic tenderness). There is no right CVA tenderness or left CVA tenderness.  Musculoskeletal:     Cervical back: Neck supple.  Skin:    General: Skin is dry.  Neurological:     General: No focal deficit present.     Mental Status: She is alert. Mental status is at baseline.     Motor: No weakness.     Gait: Gait normal.  Psychiatric:        Mood and Affect: Mood normal.        Behavior: Behavior normal.        Thought Content: Thought content normal.      UC Treatments / Results  Labs (all labs ordered are listed, but  only abnormal results are displayed) Labs Reviewed  URINALYSIS, COMPLETE (UACMP) WITH MICROSCOPIC - Abnormal; Notable for the following components:      Result Value   Color, Urine ORANGE (*)    Glucose, UA   (*)  Value: TEST NOT REPORTED DUE TO COLOR INTERFERENCE OF URINE PIGMENT   Hgb urine dipstick   (*)    Value: TEST NOT REPORTED DUE TO COLOR INTERFERENCE OF URINE PIGMENT   Bilirubin Urine   (*)    Value: TEST NOT REPORTED DUE TO COLOR INTERFERENCE OF URINE PIGMENT   Ketones, ur   (*)    Value: TEST NOT REPORTED DUE TO COLOR INTERFERENCE OF URINE PIGMENT   Protein, ur   (*)    Value: TEST NOT REPORTED DUE TO COLOR INTERFERENCE OF URINE PIGMENT   Nitrite   (*)    Value: TEST NOT REPORTED DUE TO COLOR INTERFERENCE OF URINE PIGMENT   Leukocytes,Ua   (*)    Value: TEST NOT REPORTED DUE TO COLOR INTERFERENCE OF URINE PIGMENT   Bacteria, UA MANY (*)    All other components within normal limits  URINE CULTURE    EKG   Radiology No results found.  Procedures Procedures (including critical care time)  Medications Ordered in UC Medications - No data to display  Initial Impression / Assessment and Plan / UC Course  I have reviewed the triage vital signs and the nursing notes.  Pertinent labs & imaging results that were available during my care of the patient were reviewed by me and considered in my medical decision making (see chart for details).   79 year old female with 2-week history of dysuria and urinary frequency.  Urinalysis performed today which was inconclusive since she has recently taken AZO.  We will send urine for culture and treat for UTI given her symptoms.  Advised increased rest and fluids.  Sent Macrobid to pharmacy since she has severe allergy to penicillin.  Also sent Diflucan in case she develops a yeast infection since she is prone to it especially with having diabetes.  Advised her we will call with urine culture results in a couple days.  Advise follow-up  as needed.  ED precautions for urinary symptoms discussed.  Final Clinical Impressions(s) / UC Diagnoses   Final diagnoses:  Lower urinary tract infectious disease  Dysuria     Discharge Instructions     UTI: Based on either symptoms or urinalysis, you may have a urinary tract infection. We will send the urine for culture and call with results in a few days. Begin antibiotics at this time. Your symptoms should be much improved over the next 2-3 days. Increase rest and fluid intake. If for some reason symptoms are worsening or not improving after a couple of days or the urine culture determines the antibiotics you are taking will not treat the infection, the antibiotics may be changed. Return or go to ER for fever, back pain, worsening urinary pain, discharge, increased blood in urine. May take Tylenol or Motrin OTC for pain relief or consider AZO if no contraindications   Take the Diflucan if you have any symptoms of yeast infection.  Will call with urine culture results in a couple days.    ED Prescriptions    Medication Sig Dispense Auth. Provider   nitrofurantoin, macrocrystal-monohydrate, (MACROBID) 100 MG capsule Take 1 capsule (100 mg total) by mouth 2 (two) times daily for 5 days. 10 capsule Eusebio Friendly B, PA-C   fluconazole (DIFLUCAN) 150 MG tablet Take 1 tablet every 72 hours as needed for yeast infection 2 tablet Shirlee Latch, PA-C     PDMP not reviewed this encounter.   Shirlee Latch, PA-C 02/27/20 1130

## 2020-02-27 NOTE — ED Triage Notes (Signed)
Patient c/o burning when urinating and urinary urgency that started 2 weeks ago. Patient denies fevers or chill.

## 2020-02-27 NOTE — Discharge Instructions (Signed)
UTI: Based on either symptoms or urinalysis, you may have a urinary tract infection. We will send the urine for culture and call with results in a few days. Begin antibiotics at this time. Your symptoms should be much improved over the next 2-3 days. Increase rest and fluid intake. If for some reason symptoms are worsening or not improving after a couple of days or the urine culture determines the antibiotics you are taking will not treat the infection, the antibiotics may be changed. Return or go to ER for fever, back pain, worsening urinary pain, discharge, increased blood in urine. May take Tylenol or Motrin OTC for pain relief or consider AZO if no contraindications   Take the Diflucan if you have any symptoms of yeast infection.  Will call with urine culture results in a couple days.

## 2020-02-29 LAB — URINE CULTURE
